# Patient Record
Sex: Male | Born: 1985 | Race: Black or African American | Hispanic: No | Marital: Single | Smoking: Current some day smoker
Health system: Southern US, Community
[De-identification: ages and names within clinical notes are randomized; demographics above are authoritative.]

## PROBLEM LIST (undated history)

## (undated) DIAGNOSIS — W3400XA Accidental discharge from unspecified firearms or gun, initial encounter: Secondary | ICD-10-CM

## (undated) HISTORY — PX: SPLENECTOMY, TOTAL: SHX788

---

## 2006-12-23 ENCOUNTER — Emergency Department (HOSPITAL_COMMUNITY): Admission: EM | Admit: 2006-12-23 | Discharge: 2006-12-24 | Payer: Self-pay | Admitting: Emergency Medicine

## 2007-02-28 ENCOUNTER — Inpatient Hospital Stay (HOSPITAL_COMMUNITY): Admission: EM | Admit: 2007-02-28 | Discharge: 2007-03-01 | Payer: Self-pay | Admitting: Emergency Medicine

## 2007-02-28 ENCOUNTER — Encounter (INDEPENDENT_AMBULATORY_CARE_PROVIDER_SITE_OTHER): Payer: Self-pay | Admitting: Cardiology

## 2007-02-28 ENCOUNTER — Ambulatory Visit: Payer: Self-pay | Admitting: Cardiology

## 2010-02-05 ENCOUNTER — Emergency Department (HOSPITAL_BASED_OUTPATIENT_CLINIC_OR_DEPARTMENT_OTHER)
Admission: EM | Admit: 2010-02-05 | Discharge: 2010-02-05 | Payer: Self-pay | Source: Home / Self Care | Admitting: Emergency Medicine

## 2010-06-30 NOTE — H&P (Signed)
NAME:  Ryan Hanna, Ryan Hanna NO.:  [id number]   MEDICAL RECORD NO.:  [id number]          PATIENT TYPE:  INP   LOCATION:  1823                         FACILITY:  [facility]   PHYSICIAN:  Della Goo, M.D. DATE OF BIRTH:  14-Oct-1985   DATE OF ADMISSION:  02/28/2007  DATE OF DISCHARGE:                              HISTORY & PHYSICAL   This is an unassigned patient.   CHIEF COMPLAINT:  Chest pain.   HISTORY OF PRESENT ILLNESS:  A 25 year old male who presented to the  emergency department with complaints of severe substernal chest pain  that started at 10:30 p.m.  He reports the pain was constant pain that  lasted for 3 hours and rated the pain as being an 8/10 in intensity. He  reports the pain was radiating into the back and was worse with deep  breathing. He does report having nausea but no vomiting associated with  the pain. The patient reports having similar chest pain 2 weeks ago and  this was associated with cocaine usage.  The patient reports last using  cocaine 2 weeks ago.   MEDICAL HISTORY:  None.   MEDICATIONS:  None.   ALLERGIES:  No known drug allergies.   SOCIAL HISTORY:  The patient is a smoker, smokes one pack per day.  He  also smokes marijuana and he reports cocaine usage.  He also reports  alcohol usage.   FAMILY HISTORY:  Positive for hypertension and diabetes in his father.  No history of coronary artery disease or cancer in his family that he  knows of.   REVIEW OF SYSTEMS:  Pertinent for mentioned above.   PHYSICAL EXAMINATION FINDINGS:  This is a drowsy, 25 year old male in no  visible discomfort or acute distress currently.  VITAL SIGNS:  Temperature 97.0, blood pressure 124/76, heart rate 63,  respirations 18-20, O2 saturations 98-100%.  HEENT:  Examination normocephalic, atraumatic pupils equally round,  sluggish but reactive to light. Extraocular muscles are intact.  Funduscopic benign, oropharynx is clear.  NECK:  Supple, full  range of motion.  No thyromegaly, adenopathy, or  jugulovenous distention.  CARDIOVASCULAR:  Regular rate and rhythm.  No murmurs, gallops or rubs.  LUNGS:  Clear to auscultation bilaterally.  ABDOMEN:  Positive bowel sounds, soft, nontender, nondistended.  EXTREMITIES:  Without cyanosis, clubbing or edema.  NEUROLOGIC:  The patient is drowsy but arousable.  There are no focal  deficits on examination.   LABORATORY STUDIES:  White blood cell count 6.3, hemoglobin 15.6,  hematocrit 46.1, MCV 85.2, platelets 227, neutrophils 50%, lymphocytes  38%.  Sodium 135, potassium 4.3, chloride 107, bicarb 25.2, BUN 16 and  creatinine 1.3, glucose 75. D-dimer less than 0.22.  Cardiac enzymes  with a myoglobin of 89.9, CK-MB 2.9, troponin less than 0.05 and pain.  This was an Istat cardiac marker. Serum cardiac enzymes were sent, the  results of which revealed a creatinine kinase of 319, a CK-MB of 5.2 and  a relative index of 1.6 with a troponin of 0.11. The EKG also revealed T-  wave inversion and T-wave inversion in the inferior leads  and ST  elevation in the anterior leads. Chest x-ray revealed no acute disease  findings.   ASSESSMENT:  A 25 year old male being admitted with:  1. Chest pain.  2. Polysubstance abuse/cocaine abuse.  3. Tobacco abuse.   PLAN:  The patient will be placed on telemetry monitoring and cardiac  enzymes will be continued. Nitro paste, oxygen and aspirin therapy have  been ordered.  Beta blocker therapy will be avoided secondary to the  patient's cocaine abuse.  Benzodiazepine therapy and nitrates have been  ordered and recommended as per cardiology.  Cardiology is seeing the  patient and evaluating as well.  The patient will be monitored for  further changes.  A nicotine patch has been ordered.  The patient has  also been counseled regarding his drug usage.  GI prophylaxis has been  ordered and full-dose Lovenox therapy has been ordered for now.      Della Goo, M.D.  Electronically Signed     HJ/MEDQ  D:  02/28/2007  T:  02/28/2007  Job:  [id number]

## 2010-06-30 NOTE — Consult Note (Signed)
NAME:  Ryan Hanna, Ryan Hanna NO.:  [id number]   MEDICAL RECORD NO.:  [id number]          PATIENT TYPE:  INP   LOCATION:  1823                         FACILITY:  [facility]   PHYSICIAN:  Lowella Bandy, MD      DATE OF BIRTH:  [birth year]   DATE OF CONSULTATION:  02/28/2007  DATE OF DISCHARGE:                                 CONSULTATION   REFERRING PHYSICIAN:  Dr. Lovell Sheehan, [facility].   REASON FOR CONSULTATION:  Chest pain.   HISTORY OF PRESENT ILLNESS:  Ryan Hanna is a 25 year old, African  American male with a history of polysubstance abuse including ongoing  daily cocaine who has been having chest pain for most of the past 2  weeks in the setting of daily cocaine use.  He reports that he used  cocaine the afternoon of January 12, and then presented to the ED a few  hours later after persistent, severe chest pain.  Nothing has really  helped alleviate the pain including nitroglycerin and narcotics.  He  denies any recent fevers, cough or other recent illnesses.   PAST MEDICAL HISTORY:  None.   MEDICATIONS:  None.   SOCIAL HISTORY:  The patient uses cocaine daily and frequently uses  marijuana.  He occasionally uses alcohol.  He smokes approximately a  half pack of cigarettes a day.   FAMILY HISTORY:  Negative for any premature coronary artery disease.   REVIEW OF SYSTEMS:  Ten systems reviewed and are negative other than as  noted above in the HPI.   PHYSICAL EXAMINATION:  VITAL SIGNS:  Temperature 97, blood pressure  115/53 with nitroglycerin paste in place, pulse 60, respirations 18.  GENERAL:  The patient is breathing comfortably in no apparent distress.  HEENT:  Normocephalic, atraumatic.  Extraocular movements intact.  Sclerae anicteric.  NECK:  Supple.  No masses.  No JVD or carotid bruits.  CARDIAC:  Regular rate and rhythm with normal S1, S2.  No murmurs, rubs  or gallops.  CHEST:  Clear to auscultation bilaterally.  ABDOMEN:  Soft, nontender, nondistended,  normal bowel sounds.  EXTREMITIES:  Warm with no edema with 2+ dorsalis pedis pulses  bilaterally.  SKIN:  No rashes.  MUSCULOSKELETAL:  No joint effusions or erythema.  NEUROLOGIC:  Alert and oriented x3.  Cranial nerves 2-12 grossly intact,  moving all extremities well.   LABORATORY DATA AND X-RAY FINDINGS:  EKG reviewed shows sinus rhythm at  58 beats per minute, left ventricular hypertrophy with an evolving  anterior ST and T-wave abnormality concerning for possible ischemia.   White blood cell count 6.3, hemoglobin 15.6, platelets 227.  Sodium 135,  potassium 4.3, chloride 107, bicarb 25, BUN 16, creatinine 1.3, glucose  75.  CK 319, CK-MB 5.2 (1.6%).  Troponin I initially less than 0.05 x2,  subsequently 0.11 on serum troponin.  Returned urine drug screen  positive for cocaine and marijuana.   ASSESSMENT:  A 25 year old male with chest pain, evolving anterior EKG  abnormality and mildly elevated troponin in the setting of cocaine use.  This constellation of findings is concerning for acute coronary  syndrome  that may be vasospasm provoked by cocaine.  Plaque rupture with  thrombosis seems less likely given his young age, but we would  anticoagulate him until we see that his cardiac enzymes are coming down.   RECOMMENDATIONS:  1. Cycle cardiac enzymes and EKGs.  Check an echocardiogram to      evaluate left ventricular function and regional wall motion.  2. Continue aspirin.  We would initiate therapeutic Lovenox.      Management of cocaine associated chest pain is primarily achieved      with nitrates for which he has paste in place as well as      benzodiazepines.  Beta blockers are contraindicated.  3. There is no indication at present for an emergent cardiac      catheterization and the patient would be unlikely to benefit from      such a procedure at this time.      Lowella Bandy, MD  Electronically Signed     JJC/MEDQ  D:  02/28/2007  T:  02/28/2007  Job:   [id number]

## 2010-11-05 LAB — POCT CARDIAC MARKERS
CKMB, poc: 2.3
CKMB, poc: 2.9
CKMB, poc: 2.9
Myoglobin, poc: 55
Myoglobin, poc: 59.5
Myoglobin, poc: 89.9
Operator id: 257131
Operator id: 257131
Troponin i, poc: <0.05
Troponin i, poc: <0.05

## 2010-11-05 LAB — RAPID URINE DRUG SCREEN, HOSP PERFORMED
Amphetamines: NOT DETECTED
Barbiturates: NOT DETECTED
Tetrahydrocannabinol: POSITIVE — AB

## 2010-11-05 LAB — TSH
TSH: 1.172
TSH: 1.553

## 2010-11-05 LAB — BASIC METABOLIC PANEL
CO2: 27
Calcium: 8.8
Calcium: 8.8
Creatinine, Ser: 0.96
Creatinine, Ser: 1.05
GFR calc non Af Amer: >60
Glucose, Bld: 90
Potassium: 4
Sodium: 137

## 2010-11-05 LAB — I-STAT 8, (EC8 V) (CONVERTED LAB)
BUN: 16
Chloride: 107
Hemoglobin: 17
Potassium: 4.3
pCO2, Ven: 34.8 — ABNORMAL LOW
pH, Ven: 7.467 — ABNORMAL HIGH

## 2010-11-05 LAB — CBC
HCT: 39.3
Hemoglobin: 13.4
Hemoglobin: 15.6
MCV: 85.2
RBC: 4.54
WBC: 5.8
WBC: 6.3

## 2010-11-05 LAB — DIFFERENTIAL
Basophils Absolute: 0
Basophils Absolute: 0.1
Eosinophils Relative: 3
Lymphocytes Relative: 38
Lymphocytes Relative: 54 — ABNORMAL HIGH
Lymphs Abs: 3.1
Lymphs Abs: 3.5
Monocytes Absolute: 0.4
Monocytes Absolute: 0.4
Monocytes Relative: 6
Monocytes Relative: 6
Neutro Abs: 2
Neutro Abs: 3.2
Neutrophils Relative %: 32 — ABNORMAL LOW
Neutrophils Relative %: 35 — ABNORMAL LOW
Neutrophils Relative %: 50

## 2010-11-05 LAB — CK TOTAL AND CKMB (NOT AT ARMC)
Total CK: 255 — ABNORMAL HIGH
Total CK: 319 — ABNORMAL HIGH

## 2010-11-05 LAB — CARDIAC PANEL(CRET KIN+CKTOT+MB+TROPI)
CK, MB: 3.4
Relative Index: 1.5
Total CK: 230
Troponin I: 0.01

## 2010-11-05 LAB — TROPONIN I: Troponin I: 0.02

## 2014-04-17 ENCOUNTER — Encounter (HOSPITAL_BASED_OUTPATIENT_CLINIC_OR_DEPARTMENT_OTHER): Payer: Self-pay | Admitting: *Deleted

## 2014-04-17 ENCOUNTER — Emergency Department (HOSPITAL_BASED_OUTPATIENT_CLINIC_OR_DEPARTMENT_OTHER)
Admission: EM | Admit: 2014-04-17 | Discharge: 2014-04-17 | Disposition: A | Discharge status: Home / Self Care | Payer: Self-pay | Attending: Emergency Medicine | Admitting: Emergency Medicine

## 2014-04-17 DIAGNOSIS — R369 Urethral discharge, unspecified: Secondary | ICD-10-CM | POA: Insufficient documentation

## 2014-04-17 DIAGNOSIS — Z720 Tobacco use: Secondary | ICD-10-CM | POA: Insufficient documentation

## 2014-04-17 MED ORDER — AZITHROMYCIN 250 MG PO TABS
1000.0000 mg | ORAL_TABLET | Freq: Once | ORAL | Status: AC
  Administered 2014-04-17: 1000 mg via ORAL
  Filled 2014-04-17: qty 4

## 2014-04-17 MED ORDER — LIDOCAINE HCL (PF) 1 % IJ SOLN
INTRAMUSCULAR | Status: AC
  Administered 2014-04-17: 1.5 mL
  Filled 2014-04-17: qty 5

## 2014-04-17 MED ORDER — CEFTRIAXONE SODIUM 250 MG IJ SOLR
250.0000 mg | Freq: Once | INTRAMUSCULAR | Status: AC
  Administered 2014-04-17: 250 mg via INTRAMUSCULAR
  Filled 2014-04-17: qty 250

## 2014-04-17 NOTE — ED Notes (Signed)
Supplies at bedside for gc test. All patient questions answered.

## 2014-04-17 NOTE — ED Provider Notes (Signed)
CSN: [id number]     Arrival date & time 04/17/14  1725 History   First MD Initiated Contact with Patient 04/17/14 1739     Chief Complaint  Patient presents with  . Penile Discharge     (Consider location/radiation/quality/duration/timing/severity/associated sxs/prior Treatment) HPI Comments: 29 year old male complaining of white/yellow penile discharge 1 day. Denies penile pain, swelling, testicular pain or swelling, increased urinary frequency, urgency, dysuria or hematuria. Denies abdominal pain, nausea, vomiting or fevers. Sexually active with one male partner, occasionally uses protection. States he had a sexually transmitted disease "back in the day", he is unsure of which STD.  Patient is a 29 y.o. male presenting with penile discharge. The history is provided by the patient.  Penile Discharge    History reviewed. No pertinent past medical history. History reviewed. No pertinent past surgical history. No family history on file. History  Substance Use Topics  . Smoking status: Current Every Day Smoker  . Smokeless tobacco: Not on file  . Alcohol Use: Yes    Review of Systems  Genitourinary: Positive for discharge.  All other systems reviewed and are negative.     Allergies  Review of patient's allergies indicates not on file.  Home Medications   Prior to Admission medications   Not on File   BP 143/92 mmHg  Pulse 88  Temp(Src) 98.6 F (37 C) (Oral)  Resp 16  Ht 5\' 11"  (1.803 m)  Wt 260 lb (117.935 kg)  BMI 36.28 kg/m2  SpO2 100% Physical Exam  Constitutional: He is oriented to person, place, and time. He appears well-developed and well-nourished. No distress.  HENT:  Head: Normocephalic and atraumatic.  Eyes: Conjunctivae and EOM are normal.  Neck: Normal range of motion. Neck supple.  Cardiovascular: Normal rate, regular rhythm and normal heart sounds.   Pulmonary/Chest: Effort normal and breath sounds normal.  Genitourinary: Testes normal. Right  testis shows no tenderness. Left testis shows no tenderness. Circumcised. No penile erythema or penile tenderness. Discharge (small amount of clear discharge) found.  Musculoskeletal: Normal range of motion. He exhibits no edema.  Neurological: He is alert and oriented to person, place, and time.  Skin: Skin is warm and dry.  Psychiatric: He has a normal mood and affect. His behavior is normal.  Nursing note and vitals reviewed.   ED Course  Procedures (including critical care time) Labs Review Labs Reviewed  GC/CHLAMYDIA PROBE AMP ([place])    Imaging Review No results found.   EKG Interpretation None      MDM   Final diagnoses:  Penile discharge   NAD. GC/chlamydia cultures pending. IM rocephin and PO azithromycin given. Safe sexual practices discussed. Stable for d/c. Return precautions given. Patient states understanding of treatment care plan and is agreeable.   Kathrynn SpeedRobyn M Joash Tony, PA-C 04/17/14 1809  Pricilla LovelessScott Goldston, MD 04/22/14 [phone]

## 2014-04-17 NOTE — ED Notes (Signed)
Pt reports a yellow penile discharge that started today.

## 2014-04-17 NOTE — Discharge Instructions (Signed)
You were treated today for both gonorrhea and chlamydia. If these tests result positive, you will be contacted and are then obligated to inform your partner for treatment. °Sexually Transmitted Disease °A sexually transmitted disease (STD) is a disease or infection that may be passed (transmitted) from person to person, usually during sexual activity. This may happen by way of saliva, semen, blood, vaginal mucus, or urine. Common STDs include:  °· Gonorrhea.   °· Chlamydia.   °· Syphilis.   °· HIV and AIDS.   °· Genital herpes.   °· Hepatitis B and C.   °· Trichomonas.   °· Human papillomavirus (HPV).   °· Pubic lice.   °· Scabies. °· Mites. °· Bacterial vaginosis. °WHAT ARE CAUSES OF STDs? °An STD may be caused by bacteria, a virus, or parasites. STDs are often transmitted during sexual activity if one person is infected. However, they may also be transmitted through nonsexual means. STDs may be transmitted after:  °· Sexual intercourse with an infected person.   °· Sharing sex toys with an infected person.   °· Sharing needles with an infected person or using unclean piercing or tattoo needles. °· Having intimate contact with the genitals, mouth, or rectal areas of an infected person.   °· Exposure to infected fluids during birth. °WHAT ARE THE SIGNS AND SYMPTOMS OF STDs? °Different STDs have different symptoms. Some people may not have any symptoms. If symptoms are present, they may include:  °· Painful or bloody urination.   °· Pain in the pelvis, abdomen, vagina, anus, throat, or eyes.   °· A skin rash, itching, or irritation. °· Growths, ulcerations, blisters, or sores in the genital and anal areas. °· Abnormal vaginal discharge with or without bad odor.   °· Penile discharge in men.   °· Fever.   °· Pain or bleeding during sexual intercourse.   °· Swollen glands in the groin area.   °· Yellow skin and eyes (jaundice). This is seen with hepatitis.   °· Swollen testicles. °· Infertility. °· Sores and blisters  in the mouth. °HOW ARE STDs DIAGNOSED? °To make a diagnosis, your health care provider may:  °· Take a medical history.   °· Perform a physical exam.   °· Take a sample of any discharge to examine. °· Swab the throat, cervix, opening to the penis, rectum, or vagina for testing. °· Test a sample of your first morning urine.   °· Perform blood tests.   °· Perform a Pap test, if this applies.   °· Perform a colposcopy.   °· Perform a laparoscopy.   °HOW ARE STDs TREATED? ° Treatment depends on the STD. Some STDs may be treated but not cured.  °· Chlamydia, gonorrhea, trichomonas, and syphilis can be cured with antibiotic medicine.   °· Genital herpes, hepatitis, and HIV can be treated, but not cured, with prescribed medicines. The medicines lessen symptoms.   °· Genital warts from HPV can be treated with medicine or by freezing, burning (electrocautery), or surgery. Warts may come back.   °· HPV cannot be cured with medicine or surgery. However, abnormal areas may be removed from the cervix, vagina, or vulva.   °· If your diagnosis is confirmed, your recent sexual partners need treatment. This is true even if they are symptom-free or have a negative culture or evaluation. They should not have sex until their health care providers say it is okay. °HOW CAN I REDUCE MY RISK OF GETTING AN STD? °Take these steps to reduce your risk of getting an STD: °· Use latex condoms, dental dams, and water-soluble lubricants during sexual activity. Do not use petroleum jelly or oils. °· Avoid having multiple sex   partners.  Do not have sex with someone who has other sex partners.  Do not have sex with anyone you do not know or who is at high risk for an STD.  Avoid risky sex practices that can break your skin.  Do not have sex if you have open sores on your mouth or skin.  Avoid drinking too much alcohol or taking illegal drugs. Alcohol and drugs can affect your judgment and put you in a vulnerable position.  Avoid engaging  in oral and anal sex acts.  Get vaccinated for HPV and hepatitis. If you have not received these vaccines in the past, talk to your health care provider about whether one or both might be right for you.   If you are at risk of being infected with HIV, it is recommended that you take a prescription medicine daily to prevent HIV infection. This is called pre-exposure prophylaxis (PrEP). You are considered at risk if:  You are a man who has sex with other men (MSM).  You are a heterosexual man or woman and are sexually active with more than one partner.  You take drugs by injection.  You are sexually active with a partner who has HIV.  Talk with your health care provider about whether you are at high risk of being infected with HIV. If you choose to begin PrEP, you should first be tested for HIV. You should then be tested every 3 months for as long as you are taking PrEP.  WHAT SHOULD I DO IF I THINK I HAVE AN STD?  See your health care provider.   Tell your sexual partner(s). They should be tested and treated for any STDs.  Do not have sex until your health care provider says it is okay. WHEN SHOULD I GET IMMEDIATE MEDICAL CARE? Contact your health care provider right away if:   You have severe abdominal pain.  You are a man and notice swelling or pain in your testicles.  You are a woman and notice swelling or pain in your vagina. Document Released: 04/24/2002 Document Revised: 02/06/2013 Document Reviewed: 08/22/2012 [facility] Patient Information 2015 [employer] [place]. This information is not intended to replace advice given to you by your health care provider. Make sure you discuss any questions you have with your health care provider.

## 2014-04-18 LAB — GC/CHLAMYDIA PROBE AMP (~~LOC~~) NOT AT ARMC
Chlamydia: NEGATIVE
NEISSERIA GONORRHEA: NEGATIVE

## 2016-06-10 ENCOUNTER — Emergency Department (HOSPITAL_BASED_OUTPATIENT_CLINIC_OR_DEPARTMENT_OTHER)
Admission: EM | Admit: 2016-06-10 | Discharge: 2016-06-10 | Disposition: A | Discharge status: Home / Self Care | Payer: Self-pay | Attending: Emergency Medicine | Admitting: Emergency Medicine

## 2016-06-10 ENCOUNTER — Encounter (HOSPITAL_BASED_OUTPATIENT_CLINIC_OR_DEPARTMENT_OTHER): Payer: Self-pay | Admitting: *Deleted

## 2016-06-10 DIAGNOSIS — F17210 Nicotine dependence, cigarettes, uncomplicated: Secondary | ICD-10-CM | POA: Insufficient documentation

## 2016-06-10 DIAGNOSIS — Z202 Contact with and (suspected) exposure to infections with a predominantly sexual mode of transmission: Secondary | ICD-10-CM | POA: Insufficient documentation

## 2016-06-10 LAB — WET PREP, GENITAL
Clue Cells Wet Prep HPF POC: NONE SEEN
Sperm: NONE SEEN
Trich, Wet Prep: NONE SEEN
Yeast Wet Prep HPF POC: NONE SEEN

## 2016-06-10 MED ORDER — AZITHROMYCIN 250 MG PO TABS
1000.0000 mg | ORAL_TABLET | Freq: Once | ORAL | Status: AC
  Administered 2016-06-10: 1000 mg via ORAL
  Filled 2016-06-10: qty 4

## 2016-06-10 MED ORDER — CEFTRIAXONE SODIUM 1 G IJ SOLR
1.0000 g | Freq: Once | INTRAMUSCULAR | Status: AC
  Administered 2016-06-10: 1 g via INTRAMUSCULAR
  Filled 2016-06-10: qty 10

## 2016-06-10 NOTE — ED Provider Notes (Signed)
[facility]   CSN: [id number] Arrival date & time: 06/10/16  1210     History   Chief Complaint Chief Complaint  Patient presents with  . Exposure to STD    HPI Ryan Hanna is a 31 y.o. male who presents today with chief complaint acute onset yellow penile discharge and exposure to STD. He states he noticed discharge 2 days ago while urinating. Denies dysuria, hematuria, fevers/chills, cp/sob, genital sores, masses, scrotal pain, swelling, penile pain or redness. Today he was informed by a sexual partner that she had chlamydia. Last intercourse was 2 weeks ago. He is requesting workup for HIV and syphilis as well.  The history is provided by the patient.    History reviewed. No pertinent past medical history.  There are no active problems to display for this patient.   Past Surgical History:  Procedure Laterality Date  . SPLENECTOMY, TOTAL         Home Medications    Prior to Admission medications   Not on File    Family History No family history on file.  Social History Social History  Substance Use Topics  . Smoking status: Current Every Day Smoker    Types: Cigarettes  . Smokeless tobacco: Never Used  . Alcohol use Yes     Comment: 3 drinks/weekends     Allergies   Patient has no known allergies.   Review of Systems Review of Systems  Constitutional: Positive for fever. Negative for chills.  Respiratory: Negative for shortness of breath.   Cardiovascular: Negative for chest pain.  Gastrointestinal: Negative for abdominal pain, blood in stool, diarrhea, nausea and vomiting.  Genitourinary: Positive for discharge. Negative for dysuria, genital sores, hematuria, penile pain, penile swelling, scrotal swelling and testicular pain.  Neurological: Negative for dizziness and headaches.     Physical Exam Updated Vital Signs BP 134/74 (BP Location: Right Arm)   Pulse 64   Temp 98.2 F (36.8 C) (Oral)   Resp 18   Ht 5'  11" (1.803 m)   Wt 101.6 kg   SpO2 100%   BMI 31.24 kg/m   Physical Exam  Constitutional: He is oriented to person, place, and time. He appears well-developed and well-nourished. No distress.  HENT:  Head: Normocephalic and atraumatic.  Eyes: Conjunctivae are normal. Right eye exhibits no discharge. Left eye exhibits no discharge. No scleral icterus.  Neck: No JVD present. No tracheal deviation present.  Cardiovascular: Normal rate, regular rhythm and normal heart sounds.   Pulmonary/Chest: Effort normal and breath sounds normal.  Abdominal: Soft. Bowel sounds are normal. He exhibits no distension. There is no tenderness.  Genitourinary:  Genitourinary Comments: No CVA tenderness. Examination performed in the presence of a chaperone. No penile erythema, swelling, discharge noted. No genital sores or lesions. No inguinal lymphadenopathy. No scrotal swelling, or tenderness to palpation of testes or epididymis.   Musculoskeletal: Normal range of motion.  Neurological: He is alert and oriented to person, place, and time.  Skin: Skin is warm and dry. Capillary refill takes less than 2 seconds. He is not diaphoretic. No erythema.  Psychiatric: He has a normal mood and affect. His behavior is normal.     ED Treatments / Results  Labs (all labs ordered are listed, but only abnormal results are displayed) Labs Reviewed  WET PREP, GENITAL - Abnormal; Notable for the following:       Result Value   WBC, Wet Prep HPF POC MODERATE (*)    All  other components within normal limits  RPR  HIV ANTIBODY (ROUTINE TESTING)  GC/CHLAMYDIA PROBE AMP ([place]) NOT AT [facility]    EKG  EKG Interpretation None       Radiology No results found.  Procedures Procedures (including critical care time)  Medications Ordered in ED Medications  azithromycin (ZITHROMAX) tablet 1,000 mg (1,000 mg Oral Given 06/10/16 1353)  cefTRIAXone (ROCEPHIN) injection 1 g (1 g Intramuscular Given 06/10/16 1354)      Initial Impression / Assessment and Plan / ED Course  I have reviewed the triage vital signs and the nursing notes.  Pertinent labs & imaging results that were available during my care of the patient were reviewed by me and considered in my medical decision making (see chart for details).     Patient with exposure to Chlamydia and 2 days of penile discharge. Examination unremarkable. Patient is afebrile without abdominal tenderness, abdominal pain or painful bowel movements to indicate prostatitis.  No tenderness to palpation of the testes or epididymis to suggest orchitis or epididymitis. Wet prep with moderate WBCs. STD cultures obtained including HIV, syphilis, gonorrhea and chlamydia. Patient to be discharged with instructions to follow up with PCP. Discussed importance of using protection when sexually active. Pt understands that they have GC/Chlamydia cultures pending and that they will need to inform all sexual partners if results return positive. Patient has been treated prophylactically with azithromycin and Rocephin. Discussed strict ED return precautions. Pt verbalized understanding of and agreement with plan and is safe for discharge home at this time.   Final Clinical Impressions(s) / ED Diagnoses   Final diagnoses:  STD exposure    New Prescriptions There are no discharge medications for this patient.    Jeanie Sewer, PA-C 06/10/16 1733    Vanetta Mulders, MD 06/17/16 2102

## 2016-06-10 NOTE — ED Triage Notes (Signed)
Pt reports 2 days of penile d/c. States partner told him today she has chlamydia

## 2016-06-11 LAB — GC/CHLAMYDIA PROBE AMP (~~LOC~~) NOT AT ARMC
Chlamydia: POSITIVE — AB
Neisseria Gonorrhea: NEGATIVE

## 2016-06-11 LAB — RPR: RPR Ser Ql: NONREACTIVE

## 2016-06-11 LAB — HIV ANTIBODY (ROUTINE TESTING W REFLEX): HIV Screen 4th Generation wRfx: NONREACTIVE

## 2016-12-04 ENCOUNTER — Emergency Department (HOSPITAL_COMMUNITY): Payer: Self-pay

## 2016-12-04 ENCOUNTER — Encounter (HOSPITAL_COMMUNITY): Payer: Self-pay | Admitting: Emergency Medicine

## 2016-12-04 ENCOUNTER — Emergency Department (HOSPITAL_COMMUNITY)
Admission: EM | Admit: 2016-12-04 | Discharge: 2016-12-04 | Disposition: A | Discharge status: Home / Self Care | Payer: Self-pay | Attending: Emergency Medicine | Admitting: Emergency Medicine

## 2016-12-04 DIAGNOSIS — M79605 Pain in left leg: Secondary | ICD-10-CM | POA: Insufficient documentation

## 2016-12-04 DIAGNOSIS — W3400XA Accidental discharge from unspecified firearms or gun, initial encounter: Secondary | ICD-10-CM | POA: Insufficient documentation

## 2016-12-04 DIAGNOSIS — T07XXXA Unspecified multiple injuries, initial encounter: Secondary | ICD-10-CM

## 2016-12-04 DIAGNOSIS — Z23 Encounter for immunization: Secondary | ICD-10-CM | POA: Insufficient documentation

## 2016-12-04 DIAGNOSIS — S71102A Unspecified open wound, left thigh, initial encounter: Secondary | ICD-10-CM | POA: Insufficient documentation

## 2016-12-04 DIAGNOSIS — Y999 Unspecified external cause status: Secondary | ICD-10-CM | POA: Insufficient documentation

## 2016-12-04 DIAGNOSIS — Y929 Unspecified place or not applicable: Secondary | ICD-10-CM | POA: Insufficient documentation

## 2016-12-04 DIAGNOSIS — E872 Acidosis, unspecified: Secondary | ICD-10-CM

## 2016-12-04 DIAGNOSIS — N289 Disorder of kidney and ureter, unspecified: Secondary | ICD-10-CM | POA: Insufficient documentation

## 2016-12-04 DIAGNOSIS — T1490XA Injury, unspecified, initial encounter: Secondary | ICD-10-CM

## 2016-12-04 DIAGNOSIS — F17200 Nicotine dependence, unspecified, uncomplicated: Secondary | ICD-10-CM | POA: Insufficient documentation

## 2016-12-04 DIAGNOSIS — S71101A Unspecified open wound, right thigh, initial encounter: Secondary | ICD-10-CM | POA: Insufficient documentation

## 2016-12-04 DIAGNOSIS — R0600 Dyspnea, unspecified: Secondary | ICD-10-CM | POA: Insufficient documentation

## 2016-12-04 DIAGNOSIS — Y939 Activity, unspecified: Secondary | ICD-10-CM | POA: Insufficient documentation

## 2016-12-04 DIAGNOSIS — S41002A Unspecified open wound of left shoulder, initial encounter: Secondary | ICD-10-CM | POA: Insufficient documentation

## 2016-12-04 DIAGNOSIS — S01511A Laceration without foreign body of lip, initial encounter: Secondary | ICD-10-CM | POA: Insufficient documentation

## 2016-12-04 DIAGNOSIS — S81802A Unspecified open wound, left lower leg, initial encounter: Secondary | ICD-10-CM | POA: Insufficient documentation

## 2016-12-04 LAB — TYPE AND SCREEN
ABO/RH(D): B POS
Antibody Screen: NEGATIVE
UNIT DIVISION: 0
Unit division: 0

## 2016-12-04 LAB — BASIC METABOLIC PANEL
ANION GAP: 11 (ref 5–15)
BUN: 9 mg/dL (ref 6–20)
CALCIUM: 7.9 mg/dL — AB (ref 8.9–10.3)
CO2: 20 mmol/L — AB (ref 22–32)
Chloride: 109 mmol/L (ref 101–111)
Creatinine, Ser: 0.97 mg/dL (ref 0.61–1.24)
GFR calc non Af Amer: >60 mL/min (ref >60)
Glucose, Bld: 87 mg/dL (ref 65–99)
POTASSIUM: 4 mmol/L (ref 3.5–5.1)
Sodium: 140 mmol/L (ref 135–145)

## 2016-12-04 LAB — BPAM RBC
BLOOD PRODUCT EXPIRATION DATE: 201810292359
Blood Product Expiration Date: 201810302359
ISSUE DATE / TIME: 201810200259
ISSUE DATE / TIME: 201810200259
UNIT TYPE AND RH: 9500
Unit Type and Rh: 9500

## 2016-12-04 LAB — I-STAT CHEM 8, ED
BUN: 12 mg/dL (ref 6–20)
CALCIUM ION: 0.92 mmol/L — AB (ref 1.15–1.40)
CHLORIDE: 111 mmol/L (ref 101–111)
Creatinine, Ser: 1.4 mg/dL — ABNORMAL HIGH (ref 0.61–1.24)
Glucose, Bld: 139 mg/dL — ABNORMAL HIGH (ref 65–99)
HCT: 45 % (ref 39.0–52.0)
Hemoglobin: 15.3 g/dL (ref 13.0–17.0)
Potassium: 3.7 mmol/L (ref 3.5–5.1)
SODIUM: 143 mmol/L (ref 135–145)
TCO2: 17 mmol/L — ABNORMAL LOW (ref 22–32)

## 2016-12-04 LAB — I-STAT CG4 LACTIC ACID, ED
LACTIC ACID, VENOUS: 3.65 mmol/L — AB (ref 0.5–1.9)
Lactic Acid, Venous: 9.08 mmol/L (ref 0.5–1.9)
Lactic Acid, Venous: 3.47 mmol/L (ref 0.5–1.9)

## 2016-12-04 LAB — CBC
HCT: 41.2 % (ref 39.0–52.0)
HEMATOCRIT: 36.7 % — AB (ref 39.0–52.0)
HEMOGLOBIN: 12 g/dL — AB (ref 13.0–17.0)
Hemoglobin: 13.5 g/dL (ref 13.0–17.0)
MCH: 30.7 pg (ref 26.0–34.0)
MCH: 30.5 pg (ref 26.0–34.0)
MCHC: 32.8 g/dL (ref 30.0–36.0)
MCHC: 32.7 g/dL (ref 30.0–36.0)
MCV: 93.6 fL (ref 78.0–100.0)
MCV: 93.1 fL (ref 78.0–100.0)
PLATELETS: 270 10*3/uL (ref 150–400)
Platelets: 256 10*3/uL (ref 150–400)
RBC: 4.4 MIL/uL (ref 4.22–5.81)
RBC: 3.94 MIL/uL — ABNORMAL LOW (ref 4.22–5.81)
RDW: 15.2 % (ref 11.5–15.5)
RDW: 15.4 % (ref 11.5–15.5)
WBC: 17.6 10*3/uL — ABNORMAL HIGH (ref 4.0–10.5)
WBC: 16.9 10*3/uL — AB (ref 4.0–10.5)

## 2016-12-04 LAB — COMPREHENSIVE METABOLIC PANEL
ALBUMIN: 4 g/dL (ref 3.5–5.0)
ALK PHOS: 75 U/L (ref 38–126)
ALT: 19 U/L (ref 17–63)
ANION GAP: 20 — AB (ref 5–15)
AST: 26 U/L (ref 15–41)
BILIRUBIN TOTAL: 0.4 mg/dL (ref 0.3–1.2)
BUN: 10 mg/dL (ref 6–20)
CALCIUM: 8.5 mg/dL — AB (ref 8.9–10.3)
CO2: 14 mmol/L — ABNORMAL LOW (ref 22–32)
CREATININE: 1.33 mg/dL — AB (ref 0.61–1.24)
Chloride: 106 mmol/L (ref 101–111)
GFR calc Af Amer: >60 mL/min (ref >60)
GFR calc non Af Amer: >60 mL/min (ref >60)
GLUCOSE: 139 mg/dL — AB (ref 65–99)
Potassium: 3.4 mmol/L — ABNORMAL LOW (ref 3.5–5.1)
Sodium: 140 mmol/L (ref 135–145)
TOTAL PROTEIN: 7.2 g/dL (ref 6.5–8.1)

## 2016-12-04 LAB — PREPARE FRESH FROZEN PLASMA
Unit division: 0
Unit division: 0

## 2016-12-04 LAB — URINALYSIS, ROUTINE W REFLEX MICROSCOPIC
BILIRUBIN URINE: NEGATIVE
Bacteria, UA: NONE SEEN
Glucose, UA: NEGATIVE mg/dL
KETONES UR: NEGATIVE mg/dL
LEUKOCYTES UA: NEGATIVE
Nitrite: NEGATIVE
PH: 6 (ref 5.0–8.0)
PROTEIN: NEGATIVE mg/dL
SQUAMOUS EPITHELIAL / LPF: NONE SEEN
Specific Gravity, Urine: 1.006 (ref 1.005–1.030)

## 2016-12-04 LAB — PROTIME-INR
INR: 1.05
PROTHROMBIN TIME: 13.6 s (ref 11.4–15.2)

## 2016-12-04 LAB — BPAM FFP
Blood Product Expiration Date: 201810242359
Blood Product Expiration Date: 201810242359
ISSUE DATE / TIME: 201810200300
ISSUE DATE / TIME: 201810200300
UNIT TYPE AND RH: 600
Unit Type and Rh: 6200

## 2016-12-04 LAB — CDS SEROLOGY

## 2016-12-04 LAB — I-STAT TROPONIN, ED: Troponin i, poc: 0 ng/mL (ref 0.00–0.08)

## 2016-12-04 LAB — ABO/RH: ABO/RH(D): B POS

## 2016-12-04 LAB — ETHANOL: ALCOHOL ETHYL (B): 235 mg/dL — AB (ref <10)

## 2016-12-04 LAB — LACTIC ACID, PLASMA: Lactic Acid, Venous: 2.5 mmol/L (ref 0.5–1.9)

## 2016-12-04 MED ORDER — IBUPROFEN 800 MG PO TABS: 800.0000 mg | ORAL_TABLET | Freq: Three times a day (TID) | ORAL | 0 refills | Status: AC | PRN

## 2016-12-04 MED ORDER — SODIUM CHLORIDE 0.9 % IV BOLUS (SEPSIS)
1000.0000 mL | Freq: Once | INTRAVENOUS | Status: AC
  Administered 2016-12-04: 1000 mL via INTRAVENOUS

## 2016-12-04 MED ORDER — MORPHINE SULFATE (PF) 4 MG/ML IV SOLN
INTRAVENOUS | Status: AC
  Administered 2016-12-04: 4 mg via INTRAVENOUS
  Filled 2016-12-04: qty 1

## 2016-12-04 MED ORDER — ONDANSETRON HCL 4 MG/2ML IJ SOLN
4.0000 mg | Freq: Once | INTRAMUSCULAR | Status: AC
  Administered 2016-12-04: 4 mg via INTRAVENOUS
  Filled 2016-12-04: qty 2

## 2016-12-04 MED ORDER — SODIUM CHLORIDE 0.9 % IV SOLN
INTRAVENOUS | Status: AC | PRN
  Administered 2016-12-04: 2000 mL via INTRAVENOUS

## 2016-12-04 MED ORDER — MORPHINE SULFATE (PF) 4 MG/ML IV SOLN
4.0000 mg | INTRAVENOUS | Status: AC | PRN
  Administered 2016-12-04 (×4): 4 mg via INTRAVENOUS
  Filled 2016-12-04 (×2): qty 1

## 2016-12-04 MED ORDER — LIDOCAINE HCL (PF) 1 % IJ SOLN
5.0000 mL | Freq: Once | INTRAMUSCULAR | Status: AC
Start: 2016-12-04 — End: 2016-12-04
  Administered 2016-12-04: 5 mL
  Filled 2016-12-04: qty 5

## 2016-12-04 MED ORDER — TETANUS-DIPHTH-ACELL PERTUSSIS 5-2.5-18.5 LF-MCG/0.5 IM SUSP
0.5000 mL | Freq: Once | INTRAMUSCULAR | Status: AC
  Administered 2016-12-04: 0.5 mL via INTRAMUSCULAR
  Filled 2016-12-04: qty 0.5

## 2016-12-04 MED ORDER — MORPHINE SULFATE (PF) 4 MG/ML IV SOLN
INTRAVENOUS | Status: DC
Start: 2016-12-04 — End: 2016-12-04
  Filled 2016-12-04: qty 1

## 2016-12-04 MED ORDER — OXYCODONE-ACETAMINOPHEN 5-325 MG PO TABS
2.0000 | ORAL_TABLET | Freq: Once | ORAL | Status: AC
Start: 2016-12-04 — End: 2016-12-04
  Administered 2016-12-04: 2 via ORAL
  Filled 2016-12-04: qty 2

## 2016-12-04 MED ORDER — IOPAMIDOL (ISOVUE-300) INJECTION 61%
INTRAVENOUS | Status: AC
  Administered 2016-12-04: 100 mL
  Filled 2016-12-04: qty 100

## 2016-12-04 NOTE — ED Notes (Signed)
Patient transported to CT scan . 

## 2016-12-04 NOTE — ED Notes (Signed)
Pt given crutches

## 2016-12-04 NOTE — ED Notes (Signed)
CSI tooks pictures of all wounds. All dressings changed.

## 2016-12-04 NOTE — ED Provider Notes (Signed)
..  Laceration Repair Date/Time: 12/04/2016 3:19 PM Performed by: Emi HolesLAW, Constantin Hillery M Authorized by: Emi HolesLAW, Breiana Stratmann M   Consent:    Consent obtained:  Verbal   Consent given by:  Patient   Risks discussed:  Infection, pain and poor cosmetic result   Alternatives discussed:  No treatment Anesthesia (see MAR for exact dosages):    Anesthesia method:  Local infiltration   Local anesthetic:  Lidocaine 1% w/o epi Laceration details:    Location:  Lip   Lip location:  Upper exterior lip   Length (cm):  2   Depth (mm):  2 Repair type:    Repair type:  Simple Pre-procedure details:    Preparation:  Patient was prepped and draped in usual sterile fashion Exploration:    Hemostasis achieved with:  Direct pressure   Wound exploration: wound explored through full range of motion and entire depth of wound probed and visualized     Contaminated: no   Treatment:    Area cleansed with:  Saline   Amount of cleaning:  Standard   Irrigation solution:  Sterile saline   Irrigation volume:  100cc   Irrigation method:  Syringe   Visualized foreign bodies/material removed: no   Skin repair:    Repair method:  Sutures   Suture size:  5-0   Wound skin closure material used: Vicryl Rapide.   Suture technique:  Simple interrupted   Number of sutures:  5 Approximation:    Approximation:  Close   Vermilion border: well-aligned   Post-procedure details:    Dressing:  Open (no dressing)      Emi HolesLaw, Areanna Gengler M, PA-C 12/04/16 1521    Little, Ambrose Finlandachel Morgan, MD 12/05/16 [phone]

## 2016-12-04 NOTE — ED Notes (Signed)
[place]'s [place] . Pateint's wallet given back to pt.

## 2016-12-04 NOTE — ED Notes (Signed)
Pt wounds redressed.
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
## 2016-12-04 NOTE — ED Notes (Signed)
Trauma MD reevaluated the patient and placed him for discharge.

## 2016-12-04 NOTE — ED Notes (Signed)
PA at bedside suturing pt. 
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
## 2016-12-04 NOTE — ED Notes (Addendum)
Pt has removed his blood pressure cuff. Pt does not want BP taken anymore and is angry about being here.
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
## 2016-12-04 NOTE — ED Provider Notes (Signed)
I received pt in signout from Dr. Preston FleetingGlick. We were awaiting improvement of lactate after fluids as well as reevaluation by the trauma team.  Lactate continued to improve after fluids and his vital signs remained stable. Patient evaluated by Shanda BumpsJessica, PA as well as Dr. Sheliah HatchKinsinger. I discussed his wounds with them and Dr. Sheliah HatchKinsinger recommended wet to dry dressings w/ trauma clinic f/u. Wound care given in ED and also provided w/ crutches. PA Glenford BayleyAlex Law repaired lip laceration. Extensively reviewed return precautions regarding signs of infection. Patient voiced understanding and was discharged in satisfactory condition.   Railynn Ballo, Ambrose Finlandachel Morgan, MD 12/04/16 [phone]
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
## 2016-12-06 ENCOUNTER — Encounter (HOSPITAL_BASED_OUTPATIENT_CLINIC_OR_DEPARTMENT_OTHER): Payer: Self-pay | Admitting: *Deleted

## 2017-12-13 ENCOUNTER — Emergency Department (HOSPITAL_BASED_OUTPATIENT_CLINIC_OR_DEPARTMENT_OTHER)
Admission: EM | Admit: 2017-12-13 | Discharge: 2017-12-13 | Disposition: A | Discharge status: Home / Self Care | Payer: Self-pay | Attending: Emergency Medicine | Admitting: Emergency Medicine

## 2017-12-13 ENCOUNTER — Emergency Department (HOSPITAL_BASED_OUTPATIENT_CLINIC_OR_DEPARTMENT_OTHER): Payer: Self-pay

## 2017-12-13 ENCOUNTER — Encounter (HOSPITAL_BASED_OUTPATIENT_CLINIC_OR_DEPARTMENT_OTHER): Payer: Self-pay

## 2017-12-13 ENCOUNTER — Other Ambulatory Visit: Payer: Self-pay

## 2017-12-13 DIAGNOSIS — S20211A Contusion of right front wall of thorax, initial encounter: Secondary | ICD-10-CM

## 2017-12-13 DIAGNOSIS — S20221A Contusion of right back wall of thorax, initial encounter: Secondary | ICD-10-CM | POA: Insufficient documentation

## 2017-12-13 DIAGNOSIS — S46911A Strain of unspecified muscle, fascia and tendon at shoulder and upper arm level, right arm, initial encounter: Secondary | ICD-10-CM | POA: Insufficient documentation

## 2017-12-13 DIAGNOSIS — Y998 Other external cause status: Secondary | ICD-10-CM | POA: Insufficient documentation

## 2017-12-13 DIAGNOSIS — F17200 Nicotine dependence, unspecified, uncomplicated: Secondary | ICD-10-CM | POA: Insufficient documentation

## 2017-12-13 DIAGNOSIS — S39012A Strain of muscle, fascia and tendon of lower back, initial encounter: Secondary | ICD-10-CM | POA: Insufficient documentation

## 2017-12-13 DIAGNOSIS — Y9389 Activity, other specified: Secondary | ICD-10-CM | POA: Insufficient documentation

## 2017-12-13 DIAGNOSIS — S161XXA Strain of muscle, fascia and tendon at neck level, initial encounter: Secondary | ICD-10-CM | POA: Insufficient documentation

## 2017-12-13 DIAGNOSIS — S29012A Strain of muscle and tendon of back wall of thorax, initial encounter: Secondary | ICD-10-CM | POA: Insufficient documentation

## 2017-12-13 DIAGNOSIS — S29019A Strain of muscle and tendon of unspecified wall of thorax, initial encounter: Secondary | ICD-10-CM

## 2017-12-13 DIAGNOSIS — Y92410 Unspecified street and highway as the place of occurrence of the external cause: Secondary | ICD-10-CM | POA: Insufficient documentation

## 2017-12-13 HISTORY — DX: Accidental discharge from unspecified firearms or gun, initial encounter: W34.00XA

## 2017-12-13 MED ORDER — IBUPROFEN 400 MG PO TABS
400.0000 mg | ORAL_TABLET | Freq: Once | ORAL | Status: AC
  Administered 2017-12-13: 400 mg via ORAL
  Filled 2017-12-13: qty 1
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
## 2017-12-13 NOTE — Discharge Instructions (Addendum)
Apply ice for 30 minutes at a time, 3-4 times a day.  Take ibuprofen or naproxen as needed for pain.  Take acetaminophen as needed for additional pain relief.
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
## 2019-06-04 ENCOUNTER — Encounter (HOSPITAL_BASED_OUTPATIENT_CLINIC_OR_DEPARTMENT_OTHER): Payer: Self-pay | Admitting: *Deleted

## 2019-06-04 ENCOUNTER — Other Ambulatory Visit: Payer: Self-pay

## 2019-06-04 ENCOUNTER — Emergency Department (HOSPITAL_BASED_OUTPATIENT_CLINIC_OR_DEPARTMENT_OTHER)
Admission: EM | Admit: 2019-06-04 | Discharge: 2019-06-04 | Disposition: A | Discharge status: Home / Self Care | Payer: Self-pay | Attending: Emergency Medicine | Admitting: Emergency Medicine

## 2019-06-04 DIAGNOSIS — S76319A Strain of muscle, fascia and tendon of the posterior muscle group at thigh level, unspecified thigh, initial encounter: Secondary | ICD-10-CM | POA: Insufficient documentation

## 2019-06-04 DIAGNOSIS — Y999 Unspecified external cause status: Secondary | ICD-10-CM | POA: Insufficient documentation

## 2019-06-04 DIAGNOSIS — Y9302 Activity, running: Secondary | ICD-10-CM | POA: Insufficient documentation

## 2019-06-04 DIAGNOSIS — Z720 Tobacco use: Secondary | ICD-10-CM | POA: Insufficient documentation

## 2019-06-04 DIAGNOSIS — W010XXA Fall on same level from slipping, tripping and stumbling without subsequent striking against object, initial encounter: Secondary | ICD-10-CM | POA: Insufficient documentation

## 2019-06-04 DIAGNOSIS — Y9289 Other specified places as the place of occurrence of the external cause: Secondary | ICD-10-CM | POA: Insufficient documentation

## 2019-06-04 MED ORDER — DIAZEPAM 5 MG PO TABS
5.0000 mg | ORAL_TABLET | Freq: Once | ORAL | Status: AC
  Administered 2019-06-04: 23:00:00 5 mg via ORAL
  Filled 2019-06-04: qty 1

## 2019-06-04 MED ORDER — KETOROLAC TROMETHAMINE 15 MG/ML IJ SOLN
15.0000 mg | Freq: Once | INTRAMUSCULAR | Status: AC
  Administered 2019-06-04: 15 mg via INTRAMUSCULAR
  Filled 2019-06-04: qty 1

## 2019-06-04 MED ORDER — ACETAMINOPHEN 500 MG PO TABS
1000.0000 mg | ORAL_TABLET | Freq: Once | ORAL | Status: AC
  Administered 2019-06-04: 1000 mg via ORAL
  Filled 2019-06-04: qty 2

## 2019-06-04 MED ORDER — OXYCODONE HCL 5 MG PO TABS
5.0000 mg | ORAL_TABLET | Freq: Once | ORAL | Status: AC
  Administered 2019-06-04: 5 mg via ORAL
  Filled 2019-06-04: qty 1

## 2019-06-04 NOTE — ED Triage Notes (Signed)
Bilateral leg pain since running 3 days ago. Ambulatory.

## 2019-06-04 NOTE — Discharge Instructions (Signed)
Take 4 over the counter ibuprofen tablets 3 times a day or 2 over-the-counter naproxen tablets twice a day for pain. Also take tylenol 1000mg(2 extra strength) four times a day.    
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
## 2021-11-21 ENCOUNTER — Encounter (HOSPITAL_BASED_OUTPATIENT_CLINIC_OR_DEPARTMENT_OTHER): Payer: Self-pay | Admitting: Emergency Medicine

## 2021-11-21 ENCOUNTER — Emergency Department (HOSPITAL_BASED_OUTPATIENT_CLINIC_OR_DEPARTMENT_OTHER): Payer: Self-pay

## 2021-11-21 ENCOUNTER — Other Ambulatory Visit: Payer: Self-pay

## 2021-11-21 ENCOUNTER — Emergency Department (HOSPITAL_BASED_OUTPATIENT_CLINIC_OR_DEPARTMENT_OTHER)
Admission: EM | Admit: 2021-11-21 | Discharge: 2021-11-21 | Disposition: A | Discharge status: Home / Self Care | Payer: Self-pay | Attending: Emergency Medicine | Admitting: Emergency Medicine

## 2021-11-21 DIAGNOSIS — W1839XA Other fall on same level, initial encounter: Secondary | ICD-10-CM | POA: Insufficient documentation

## 2021-11-21 DIAGNOSIS — T148XXA Other injury of unspecified body region, initial encounter: Secondary | ICD-10-CM

## 2021-11-21 DIAGNOSIS — S40011A Contusion of right shoulder, initial encounter: Secondary | ICD-10-CM | POA: Insufficient documentation

## 2021-11-21 DIAGNOSIS — R0789 Other chest pain: Secondary | ICD-10-CM | POA: Insufficient documentation

## 2021-11-21 MED ORDER — HYDROCODONE-ACETAMINOPHEN 5-325 MG PO TABS
2.0000 | ORAL_TABLET | Freq: Once | ORAL | Status: AC
  Administered 2021-11-21: 2 via ORAL
  Filled 2021-11-21: qty 2
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
## 2021-11-21 NOTE — ED Notes (Signed)
Pt d/c home per MD order. Discharge summary reviewed with pt, pt verbalizes understanding. No s/s of acute distress noted at discharge. Pt visitor reports she is discharge ride home.

## 2021-11-21 NOTE — ED Provider Notes (Signed)
[facility] EMERGENCY DEPARTMENT Provider Note   CSN: [id number] Arrival date & time: 11/21/21  1904     History  Chief Complaint  Patient presents with   Clavicle Injury    Ryan Hanna is a 36 y.o. male.  Patient is a 36 year old who presents with right-sided clavicle and chest pain after a fall.  He states he fell on a porch onto his right side.  He has pain in his right clavicle and along his right ribs.  No radiation down his arms.  No numbness or weakness in his arm.  No other injuries.  He did not hit his head.  No loss of consciousness.  No neck or back pain.  This happened yesterday.       Home Medications Prior to Admission medications   Medication Sig Start Date End Date Taking? Authorizing Provider  ibuprofen (ADVIL,MOTRIN) 800 MG tablet Take 1 tablet (800 mg total) by mouth every 8 (eight) hours as needed. 12/04/16   Kinsinger, De Blanch, MD      Allergies    Patient has no known allergies.    Review of Systems   Review of Systems  Constitutional:  Negative for chills, diaphoresis, fatigue and fever.  HENT:  Negative for congestion, rhinorrhea and sneezing.   Eyes: Negative.   Respiratory:  Negative for cough, chest tightness and shortness of breath.   Cardiovascular:  Positive for chest pain (Right rib pain). Negative for leg swelling.  Gastrointestinal:  Negative for abdominal pain, blood in stool, diarrhea, nausea and vomiting.  Genitourinary:  Negative for difficulty urinating, flank pain, frequency and hematuria.  Musculoskeletal:  Positive for arthralgias. Negative for back pain.  Skin:  Negative for rash.  Neurological:  Negative for dizziness, speech difficulty, weakness, numbness and headaches.    Physical Exam Updated Vital Signs BP (!) 146/82 (BP Location: Left Arm)   Pulse 95   Temp 98.4 F (36.9 C) (Oral)   Resp 18   Ht 6' (1.829 m)   Wt 99.8 kg   SpO2 93%   BMI 29.84 kg/m  Physical Exam Constitutional:       Appearance: He is well-developed.  HENT:     Head: Normocephalic and atraumatic.  Eyes:     Pupils: Pupils are equal, round, and reactive to light.  Neck:     Comments: No pain along the cervical, thoracic or lumbosacral spine Cardiovascular:     Rate and Rhythm: Normal rate and regular rhythm.     Heart sounds: Normal heart sounds.  Pulmonary:     Effort: Pulmonary effort is normal. No respiratory distress.     Breath sounds: Normal breath sounds. No wheezing or rales.  Chest:     Chest wall: Tenderness (Tenderness to the right lateral rib 6, no rubs or deformity, no external trauma noted) present.  Abdominal:     General: Bowel sounds are normal.     Palpations: Abdomen is soft.     Tenderness: There is no abdominal tenderness. There is no guarding or rebound.  Musculoskeletal:        General: Normal range of motion.     Cervical back: Normal range of motion and neck supple.     Comments: Positive tenderness over the right clavicle.  There is no specific pain to the shoulder itself.  No swelling or deformities noted.  Lymphadenopathy:     Cervical: No cervical adenopathy.  Skin:    General: Skin is warm and dry.  Findings: No rash.  Neurological:     General: No focal deficit present.     Mental Status: He is alert and oriented to person, place, and time.     ED Results / Procedures / Treatments   Labs (all labs ordered are listed, but only abnormal results are displayed) Labs Reviewed - No data to display  EKG None  Radiology DG Ribs Unilateral W/Chest Right  Result Date: 11/21/2021 CLINICAL DATA:  Fall, rib pain EXAM: RIGHT RIBS AND CHEST - 3+ VIEW COMPARISON:  None Available. FINDINGS: Mild left basilar opacity, likely atelectasis or scarring. Right lung is clear. No pleural effusion or pneumothorax. The heart is normal in size. Visualized osseous structures are within normal limits. No displaced rib fracture is seen. Clavicles are intact. IMPRESSION: Negative.  Electronically Signed   By: Julian Hy M.D.   On: 11/21/2021 20:05   DG Clavicle Right  Result Date: 11/21/2021 CLINICAL DATA:  Trauma, fall, pain EXAM: RIGHT CLAVICLE - 2+ VIEWS COMPARISON:  Right shoulder done on 12/13/2017 FINDINGS: There is no evidence of fracture or other focal bone lesions. Soft tissues are unremarkable. IMPRESSION: No fracture is seen in right clavicle. Electronically Signed   By: Elmer Picker M.D.   On: 11/21/2021 19:57    Procedures Procedures    Medications Ordered in ED Medications  HYDROcodone-acetaminophen (NORCO/VICODIN) 5-325 MG per tablet 2 tablet (2 tablets Oral Given 11/21/21 2012)    ED Course/ Medical Decision Making/ A&P                           Medical Decision Making Amount and/or Complexity of Data Reviewed Radiology: ordered.  Risk Prescription drug management.   Patient presents with pain to his right clavicle and chest wall area after a fall.  I do not see any evidence of swelling or external trauma.  He does not have pain to the shoulder or arm itself.  X-rays of the right clavicle and the right ribs were interpreted by me and confirmed by the radiologist to show no evidence of fracture.  No pneumothorax.  No dislocation.  No other injuries are identified.  He was advised in symptomatic care.  He was given dose of Vicodin for pain control in the ED.  At home I advised him to use ibuprofen and Tylenol.  He was given a referral to follow-up with sports medicine if his symptoms are not improving.  Return precautions were given.    Final Clinical Impression(s) / ED Diagnoses Final diagnoses:  Contusion of right clavicle, initial encounter    Rx / DC Orders ED Discharge Orders     None         Malvin Johns, MD 11/21/21 2024

## 2021-11-21 NOTE — Discharge Instructions (Addendum)
You can take ibuprofen and Tylenol for symptomatic relief.  Apply ice to the area several times a day.  It may be more comfortable to sleep semireclined.  Make an appointment to follow-up with the sports medicine doctor listed above if your symptoms are not improving.  Return to emergency room if you have any worsening symptoms.

## 2021-11-21 NOTE — ED Notes (Signed)
Patient transported to X-ray
